# Patient Record
Sex: Male | Born: 2000 | Race: White | Hispanic: No | Marital: Single | Smoking: Never smoker
Health system: Southern US, Community
[De-identification: ages and names within clinical notes are randomized; demographics above are authoritative.]

## PROBLEM LIST (undated history)

## (undated) DIAGNOSIS — E669 Obesity, unspecified: Secondary | ICD-10-CM

## (undated) DIAGNOSIS — K219 Gastro-esophageal reflux disease without esophagitis: Secondary | ICD-10-CM

## (undated) DIAGNOSIS — E66811 Obesity, class 1: Secondary | ICD-10-CM

## (undated) DIAGNOSIS — Z7289 Other problems related to lifestyle: Secondary | ICD-10-CM

---
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
## 2022-06-10 ENCOUNTER — Other Ambulatory Visit: Payer: Self-pay

## 2022-06-10 ENCOUNTER — Emergency Department: Payer: Medicaid Other

## 2022-06-10 DIAGNOSIS — Z20822 Contact with and (suspected) exposure to covid-19: Secondary | ICD-10-CM | POA: Diagnosis present

## 2022-06-10 DIAGNOSIS — Y929 Unspecified place or not applicable: Secondary | ICD-10-CM

## 2022-06-10 DIAGNOSIS — K2970 Gastritis, unspecified, without bleeding: Secondary | ICD-10-CM | POA: Diagnosis present

## 2022-06-10 DIAGNOSIS — K219 Gastro-esophageal reflux disease without esophagitis: Secondary | ICD-10-CM | POA: Diagnosis present

## 2022-06-10 DIAGNOSIS — G030 Nonpyogenic meningitis: Secondary | ICD-10-CM | POA: Diagnosis present

## 2022-06-10 DIAGNOSIS — E669 Obesity, unspecified: Secondary | ICD-10-CM | POA: Diagnosis present

## 2022-06-10 DIAGNOSIS — A770 Spotted fever due to Rickettsia rickettsii: Secondary | ICD-10-CM | POA: Diagnosis present

## 2022-06-10 DIAGNOSIS — F17290 Nicotine dependence, other tobacco product, uncomplicated: Secondary | ICD-10-CM | POA: Diagnosis present

## 2022-06-10 DIAGNOSIS — Z833 Family history of diabetes mellitus: Secondary | ICD-10-CM

## 2022-06-10 DIAGNOSIS — Z6830 Body mass index (BMI) 30.0-30.9, adult: Secondary | ICD-10-CM

## 2022-06-10 DIAGNOSIS — W57XXXA Bitten or stung by nonvenomous insect and other nonvenomous arthropods, initial encounter: Secondary | ICD-10-CM | POA: Diagnosis present

## 2022-06-10 DIAGNOSIS — A419 Sepsis, unspecified organism: Principal | ICD-10-CM | POA: Diagnosis present

## 2022-06-10 LAB — CBC WITH DIFFERENTIAL/PLATELET
Abs Immature Granulocytes: 0.07 10*3/uL (ref 0.00–0.07)
Basophils Absolute: 0.1 10*3/uL (ref 0.0–0.1)
Basophils Relative: 0 %
Eosinophils Absolute: 0 10*3/uL (ref 0.0–0.5)
Eosinophils Relative: 0 %
HCT: 46.6 % (ref 39.0–52.0)
Hemoglobin: 16.1 g/dL (ref 13.0–17.0)
Immature Granulocytes: 1 %
Lymphocytes Relative: 11 %
Lymphs Abs: 1.7 10*3/uL (ref 0.7–4.0)
MCH: 29.7 pg (ref 26.0–34.0)
MCHC: 34.5 g/dL (ref 30.0–36.0)
MCV: 86 fL (ref 80.0–100.0)
Monocytes Absolute: 1.6 10*3/uL — ABNORMAL HIGH (ref 0.1–1.0)
Monocytes Relative: 10 %
Neutro Abs: 12 10*3/uL — ABNORMAL HIGH (ref 1.7–7.7)
Neutrophils Relative %: 78 %
Platelets: 314 10*3/uL (ref 150–400)
RBC: 5.42 MIL/uL (ref 4.22–5.81)
RDW: 12.3 % (ref 11.5–15.5)
WBC: 15.4 10*3/uL — ABNORMAL HIGH (ref 4.0–10.5)
nRBC: 0 % (ref 0.0–0.2)

## 2022-06-10 LAB — URINALYSIS, ROUTINE W REFLEX MICROSCOPIC
Bilirubin Urine: NEGATIVE
Glucose, UA: NEGATIVE mg/dL
Hgb urine dipstick: NEGATIVE
Ketones, ur: NEGATIVE mg/dL
Leukocytes,Ua: NEGATIVE
Nitrite: NEGATIVE
Protein, ur: NEGATIVE mg/dL
Specific Gravity, Urine: 1.017 (ref 1.005–1.030)
pH: 7 (ref 5.0–8.0)

## 2022-06-10 LAB — COMPREHENSIVE METABOLIC PANEL
ALT: 36 U/L (ref 0–44)
AST: 24 U/L (ref 15–41)
Albumin: 4.1 g/dL (ref 3.5–5.0)
Alkaline Phosphatase: 57 U/L (ref 38–126)
Anion gap: 10 (ref 5–15)
BUN: 11 mg/dL (ref 6–20)
CO2: 26 mmol/L (ref 22–32)
Calcium: 9.1 mg/dL (ref 8.9–10.3)
Chloride: 101 mmol/L (ref 98–111)
Creatinine, Ser: 0.87 mg/dL (ref 0.61–1.24)
GFR, Estimated: >60 mL/min (ref >60)
Glucose, Bld: 107 mg/dL — ABNORMAL HIGH (ref 70–99)
Potassium: 3.7 mmol/L (ref 3.5–5.1)
Sodium: 137 mmol/L (ref 135–145)
Total Bilirubin: 1.2 mg/dL (ref 0.3–1.2)
Total Protein: 7.8 g/dL (ref 6.5–8.1)

## 2022-06-10 LAB — SARS CORONAVIRUS 2 BY RT PCR: SARS Coronavirus 2 by RT PCR: NEGATIVE

## 2022-06-10 LAB — LACTIC ACID, PLASMA: Lactic Acid, Venous: 1.3 mmol/L (ref 0.5–1.9)

## 2022-06-10 MED ORDER — ACETAMINOPHEN 500 MG PO TABS
1000.0000 mg | ORAL_TABLET | Freq: Once | ORAL | Status: AC
  Administered 2022-06-10: 1000 mg via ORAL
  Filled 2022-06-10: qty 2

## 2022-06-10 NOTE — ED Triage Notes (Signed)
Pt presents via POV c/o fever, headache, and abd pain. Fever 103.1 currently. Reports symptoms started this am.

## 2022-06-11 ENCOUNTER — Inpatient Hospital Stay
Admission: EM | Admit: 2022-06-11 | Discharge: 2022-06-12 | DRG: 871 | Disposition: A | Discharge status: Home / Self Care | Payer: Medicaid Other | Attending: Internal Medicine | Admitting: Internal Medicine

## 2022-06-11 ENCOUNTER — Encounter: Payer: Self-pay | Admitting: Internal Medicine

## 2022-06-11 ENCOUNTER — Emergency Department: Payer: Medicaid Other

## 2022-06-11 ENCOUNTER — Other Ambulatory Visit: Payer: Self-pay

## 2022-06-11 DIAGNOSIS — G030 Nonpyogenic meningitis: Secondary | ICD-10-CM | POA: Diagnosis present

## 2022-06-11 DIAGNOSIS — R519 Headache, unspecified: Secondary | ICD-10-CM | POA: Diagnosis present

## 2022-06-11 DIAGNOSIS — A770 Spotted fever due to Rickettsia rickettsii: Secondary | ICD-10-CM | POA: Diagnosis present

## 2022-06-11 DIAGNOSIS — A419 Sepsis, unspecified organism: Secondary | ICD-10-CM | POA: Diagnosis present

## 2022-06-11 DIAGNOSIS — E669 Obesity, unspecified: Secondary | ICD-10-CM | POA: Diagnosis present

## 2022-06-11 DIAGNOSIS — G039 Meningitis, unspecified: Secondary | ICD-10-CM | POA: Diagnosis not present

## 2022-06-11 DIAGNOSIS — A879 Viral meningitis, unspecified: Principal | ICD-10-CM

## 2022-06-11 DIAGNOSIS — Z7289 Other problems related to lifestyle: Secondary | ICD-10-CM

## 2022-06-11 DIAGNOSIS — K2970 Gastritis, unspecified, without bleeding: Secondary | ICD-10-CM | POA: Diagnosis present

## 2022-06-11 DIAGNOSIS — R509 Fever, unspecified: Secondary | ICD-10-CM

## 2022-06-11 DIAGNOSIS — Z6830 Body mass index (BMI) 30.0-30.9, adult: Secondary | ICD-10-CM | POA: Diagnosis not present

## 2022-06-11 DIAGNOSIS — K219 Gastro-esophageal reflux disease without esophagitis: Secondary | ICD-10-CM | POA: Diagnosis present

## 2022-06-11 DIAGNOSIS — F17290 Nicotine dependence, other tobacco product, uncomplicated: Secondary | ICD-10-CM | POA: Diagnosis present

## 2022-06-11 DIAGNOSIS — W57XXXA Bitten or stung by nonvenomous insect and other nonvenomous arthropods, initial encounter: Secondary | ICD-10-CM | POA: Diagnosis present

## 2022-06-11 DIAGNOSIS — Y929 Unspecified place or not applicable: Secondary | ICD-10-CM | POA: Diagnosis not present

## 2022-06-11 DIAGNOSIS — Z833 Family history of diabetes mellitus: Secondary | ICD-10-CM | POA: Diagnosis not present

## 2022-06-11 DIAGNOSIS — Z20822 Contact with and (suspected) exposure to covid-19: Secondary | ICD-10-CM | POA: Diagnosis present

## 2022-06-11 HISTORY — DX: Obesity, unspecified: E66.9

## 2022-06-11 HISTORY — DX: Gastro-esophageal reflux disease without esophagitis: K21.9

## 2022-06-11 HISTORY — DX: Obesity, class 1: E66.811

## 2022-06-11 HISTORY — DX: Other problems related to lifestyle: Z72.89

## 2022-06-11 LAB — CSF CELL COUNT WITH DIFFERENTIAL
Eosinophils, CSF: 0 % (ref 0–1)
Eosinophils, CSF: 0 % (ref 0–1)
Lymphs, CSF: 36 % — ABNORMAL LOW (ref 40–80)
Lymphs, CSF: 58 % (ref 40–80)
Monocyte-Macrophage-Spinal Fluid: 42 % (ref 15–45)
Monocyte-Macrophage-Spinal Fluid: 64 % — ABNORMAL HIGH (ref 15–45)
Other Cells, CSF: 0
Other Cells, CSF: 0
RBC Count, CSF: 0 /mm3
RBC Count, CSF: 0 /mm3
Segmented Neutrophils-CSF: 0 % (ref 0–6)
Segmented Neutrophils-CSF: 0 % (ref 0–6)
Tube #: 1
Tube #: 4
WBC, CSF: 12 /mm3 (ref 0–5)
WBC, CSF: 23 /mm3 (ref 0–5)

## 2022-06-11 LAB — MENINGITIS/ENCEPHALITIS PANEL (CSF)

## 2022-06-11 LAB — RESPIRATORY PANEL BY PCR

## 2022-06-11 LAB — PROCALCITONIN: Procalcitonin: <0.1 ng/mL

## 2022-06-11 LAB — PROTIME-INR
INR: 1.1 (ref 0.8–1.2)
Prothrombin Time: 14 seconds (ref 11.4–15.2)

## 2022-06-11 LAB — PROTEIN AND GLUCOSE, CSF
Glucose, CSF: 61 mg/dL (ref 40–70)
Total  Protein, CSF: 17 mg/dL (ref 15–45)

## 2022-06-11 LAB — PATHOLOGIST SMEAR REVIEW

## 2022-06-11 MED ORDER — DEXTROSE 5 % IV SOLN
10.0000 mg/kg | Freq: Once | INTRAVENOUS | Status: AC
  Administered 2022-06-11: 755 mg via INTRAVENOUS
  Filled 2022-06-11: qty 15.1

## 2022-06-11 MED ORDER — SODIUM CHLORIDE 0.9 % IV SOLN: 2.0000 g | Freq: Two times a day (BID) | INTRAVENOUS | Status: DC

## 2022-06-11 MED ORDER — ACETAMINOPHEN 325 MG PO TABS
650.0000 mg | ORAL_TABLET | Freq: Four times a day (QID) | ORAL | Status: DC | PRN
  Administered 2022-06-11: 650 mg via ORAL
  Filled 2022-06-11: qty 2

## 2022-06-11 MED ORDER — VANCOMYCIN HCL IN DEXTROSE 1-5 GM/200ML-% IV SOLN
1000.0000 mg | Freq: Once | INTRAVENOUS | Status: DC
  Filled 2022-06-11: qty 200

## 2022-06-11 MED ORDER — SODIUM CHLORIDE 0.9 % IV SOLN
2.0000 g | Freq: Once | INTRAVENOUS | Status: AC
  Administered 2022-06-11: 2 g via INTRAVENOUS
  Filled 2022-06-11: qty 20

## 2022-06-11 MED ORDER — SODIUM CHLORIDE 0.9 % IV BOLUS
2000.0000 mL | Freq: Once | INTRAVENOUS | Status: AC
  Administered 2022-06-11: 2000 mL via INTRAVENOUS

## 2022-06-11 MED ORDER — SODIUM CHLORIDE 0.9 % IV SOLN
100.0000 mg | Freq: Two times a day (BID) | INTRAVENOUS | Status: DC
  Filled 2022-06-11: qty 100

## 2022-06-11 MED ORDER — ONDANSETRON HCL 4 MG/2ML IJ SOLN
INTRAMUSCULAR | Status: AC
  Administered 2022-06-11: 4 mg
  Filled 2022-06-11: qty 2

## 2022-06-11 MED ORDER — DOXYCYCLINE HYCLATE 100 MG IV SOLR
200.0000 mg | Freq: Once | INTRAVENOUS | Status: AC
  Administered 2022-06-11: 200 mg via INTRAVENOUS
  Filled 2022-06-11: qty 200

## 2022-06-11 MED ORDER — SODIUM CHLORIDE 0.9 % IV SOLN
100.0000 mg | Freq: Two times a day (BID) | INTRAVENOUS | Status: DC
  Administered 2022-06-12: 100 mg via INTRAVENOUS
  Filled 2022-06-11 (×2): qty 100

## 2022-06-11 MED ORDER — SODIUM CHLORIDE 0.9 % IV SOLN: INTRAVENOUS | Status: DC

## 2022-06-11 MED ORDER — ONDANSETRON HCL 4 MG/2ML IJ SOLN: 4.0000 mg | Freq: Three times a day (TID) | INTRAMUSCULAR | Status: DC | PRN

## 2022-06-11 MED ORDER — DEXAMETHASONE SODIUM PHOSPHATE 10 MG/ML IJ SOLN
10.0000 mg | Freq: Once | INTRAMUSCULAR | Status: AC
  Administered 2022-06-11: 10 mg via INTRAVENOUS
  Filled 2022-06-11: qty 1

## 2022-06-11 MED ORDER — PANTOPRAZOLE SODIUM 40 MG PO TBEC: 40.0000 mg | DELAYED_RELEASE_TABLET | Freq: Every day | ORAL | Status: DC | PRN

## 2022-06-11 MED ORDER — VANCOMYCIN HCL 1500 MG/300ML IV SOLN
1500.0000 mg | Freq: Once | INTRAVENOUS | Status: AC
  Administered 2022-06-11: 1500 mg via INTRAVENOUS
  Filled 2022-06-11: qty 300

## 2022-06-11 NOTE — Assessment & Plan Note (Signed)
-  see above 

## 2022-06-11 NOTE — ED Provider Notes (Signed)
[facility] Provider Note    Event Date/Time   First MD Initiated Contact with Patient 06/11/22 0211     (approximate)   History   Headache (abd) and Abdominal Pain   HPI  Johnathan Mosley is a 21 y.o. male with no significant past medical history who presents to the emergency department complaints of fever, headache and neck stiffness that started today.  No cough, congestion, sore throat, chest pain, shortness of breath, abdominal pain, nausea, vomiting, diarrhea, dysuria, rash.  No known sick contacts or recent travel.   History provided by patient and significant other.    History reviewed. No pertinent past medical history.  History reviewed. No pertinent surgical history.  MEDICATIONS:  Prior to Admission medications   Not on File    Physical Exam   Triage Vital Signs: ED Triage Vitals  Enc Vitals Group     BP 06/10/22 1956 123/81     Pulse Rate 06/10/22 1956 (!) 138     Resp 06/10/22 1956 18     Temp 06/10/22 1956 (!) 103.1 F (39.5 C)     Temp Source 06/10/22 1956 Oral     SpO2 06/10/22 1956 96 %     Weight 06/10/22 1956 217 lb (98.4 kg)     Height 06/10/22 1956 5\' 11"  (1.803 m)     Head Circumference --      Peak Flow --      Pain Score 06/10/22 2010 7     Pain Loc --      Pain Edu? --      Excl. in GC? --     Most recent vital signs: Vitals:   06/11/22 0558 06/11/22 0600  BP:  112/73  Pulse: 91 92  Resp: 18 16  Temp:  98.3 F (36.8 C)  SpO2: 96% 98%    CONSTITUTIONAL: Alert and oriented and responds appropriately to questions. Well-appearing; well-nourished HEAD: Normocephalic, atraumatic EYES: Conjunctivae clear, pupils appear equal, sclera nonicteric ENT: normal nose; moist mucous membranes; No pharyngeal erythema or petechiae, no tonsillar hypertrophy or exudate, no uvular deviation, no unilateral swelling in posterior oropharynx, no trismus or drooling, no muffled voice, normal phonation, no stridor, airway  patent. NECK: Supple, normal ROM, no meningismus, no cervical lymphadenopathy CARD: RRR; S1 and S2 appreciated; no murmurs, no clicks, no rubs, no gallops RESP: Normal chest excursion without splinting or tachypnea; breath sounds clear and equal bilaterally; no wheezes, no rhonchi, no rales, no hypoxia or respiratory distress, speaking full sentences ABD/GI: Normal bowel sounds; non-distended; soft, non-tender, no rebound, no guarding, no peritoneal signs BACK: The back appears normal EXT: Normal ROM in all joints; no deformity noted, no edema; no cyanosis SKIN: Normal color for age and race; warm; no rash on exposed skin NEURO: Moves all extremities equally, normal speech, no facial asymmetry PSYCH: The patient's mood and manner are appropriate.   ED Results / Procedures / Treatments   LABS: (all labs ordered are listed, but only abnormal results are displayed) Labs Reviewed  COMPREHENSIVE METABOLIC PANEL - Abnormal; Notable for the following components:      Result Value   Glucose, Bld 107 (*)    All other components within normal limits  CBC WITH DIFFERENTIAL/PLATELET - Abnormal; Notable for the following components:   WBC 15.4 (*)    Neutro Abs 12.0 (*)    Monocytes Absolute 1.6 (*)    All other components within normal limits  URINALYSIS, ROUTINE W REFLEX MICROSCOPIC - Abnormal; Notable  for the following components:   Color, Urine YELLOW (*)    APPearance CLEAR (*)    All other components within normal limits  CSF CELL COUNT WITH DIFFERENTIAL - Abnormal; Notable for the following components:   WBC, CSF 23 (*)    All other components within normal limits  CSF CELL COUNT WITH DIFFERENTIAL - Abnormal; Notable for the following components:   WBC, CSF 12 (*)    Lymphs, CSF 36 (*)    Monocyte-Macrophage-Spinal Fluid 64 (*)    All other components within normal limits  SARS CORONAVIRUS 2 BY RT PCR  CSF CULTURE W GRAM STAIN  CULTURE, BLOOD (ROUTINE X 2)  CULTURE, BLOOD (ROUTINE  X 2)  RESPIRATORY PANEL BY PCR  LACTIC ACID, PLASMA  PROCALCITONIN  PROTEIN AND GLUCOSE, CSF  PROTIME-INR  HSV 1/2 PCR, CSF  HIV ANTIBODY (ROUTINE TESTING W REFLEX)  PATHOLOGIST SMEAR REVIEW     EKG:   RADIOLOGY: My personal review and interpretation of imaging: Chest x-ray clear.  Head CT unremarkable.  I have personally reviewed all radiology reports.   CT HEAD WO CONTRAST ( )  Result Date: 06/11/2022 CLINICAL DATA:  Fever and headache; meningitis suspected EXAM: CT HEAD WITHOUT CONTRAST TECHNIQUE: Contiguous axial images were obtained from the base of the skull through the vertex without intravenous contrast. RADIATION DOSE REDUCTION: This exam was performed according to the departmental dose-optimization program which includes automated exposure control, adjustment of the mA and/or kV according to patient size and/or use of iterative reconstruction technique. COMPARISON:  None Available. FINDINGS: Brain: No intracranial hemorrhage, mass effect, or evidence of acute infarct. No hydrocephalus. No extra-axial fluid collection. Vascular: No hyperdense vessel or unexpected calcification. Skull: No fracture or focal lesion. Sinuses/Orbits: No acute finding. Paranasal sinuses and mastoid air cells are well aerated. Other: None. IMPRESSION: Unremarkable noncontrast CT head. If there is ongoing concern for meningitis consider MRI for further evaluation. Electronically Signed   By: Minerva Fester M.D.   On: 06/11/2022 03:38   DG Chest 2 View  Result Date: 06/10/2022 CLINICAL DATA:  Fever, headache and abdominal pain EXAM: CHEST - 2 VIEW COMPARISON:  None Available. FINDINGS: The heart size and mediastinal contours are within normal limits. Both lungs are clear. The visualized skeletal structures are unremarkable. IMPRESSION: No active cardiopulmonary disease. Electronically Signed   By: Judie Petit.  Shick M.D.   On: 06/10/2022 20:40     PROCEDURES:  Critical Care performed: Yes, see critical care  procedure note(s)   CRITICAL CARE Performed by: Baxter Hire Roseanne Juenger   Total critical care time: 45 minutes  Critical care time was exclusive of separately billable procedures and treating other patients.  Critical care was necessary to treat or prevent imminent or life-threatening deterioration.  Critical care was time spent personally by me on the following activities: development of treatment plan with patient and/or surrogate as well as nursing, discussions with consultants, evaluation of patient's response to treatment, examination of patient, obtaining history from patient or surrogate, ordering and performing treatments and interventions, ordering and review of laboratory studies, ordering and review of radiographic studies, pulse oximetry and re-evaluation of patient's condition.   .Lumbar Puncture  Date/Time: 06/11/2022 5:22 AM  Performed by: Kijana Estock, Layla Maw, DO Authorized by: Denney Shein, Layla Maw, DO   Consent:    Consent obtained:  Written   Consent given by:  Patient   Risks, benefits, and alternatives were discussed: yes     Risks discussed:  Bleeding, headache, pain, repeat procedure, nerve damage and infection  Alternatives discussed:  Observation Universal protocol:    Procedure explained and questions answered to patient or proxy's satisfaction: yes     Relevant documents present and verified: yes     Test results available: yes     Imaging studies available: yes     Required blood products, implants, devices, and special equipment available: yes     Immediately prior to procedure a time out was called: yes     Site/side marked: yes     Patient identity confirmed:  Verbally with patient Pre-procedure details:    Procedure purpose:  Diagnostic   Preparation: Patient was prepped and draped in usual sterile fashion   Anesthesia:    Anesthesia method:  Local infiltration   Local anesthetic:  Lidocaine 1% w/o epi Procedure details:    Lumbar space:  L4-L5 interspace    Patient position:  Sitting   Needle gauge:  20   Needle type:  Spinal needle - Quincke tip   Needle length (in):  3.5   Ultrasound guidance: no     Number of attempts:  5 or more   Fluid appearance:  Clear   Tubes of fluid:  4   Total volume (ml):  6 Post-procedure details:    Puncture site:  Adhesive bandage applied and direct pressure applied   Procedure completion:  Tolerated well, no immediate complications     IMPRESSION / MDM / ASSESSMENT AND PLAN / ED COURSE  I reviewed the triage vital signs and the nursing notes.    Patient here with fevers, neck pain and neck stiffness.  No meningismus on exam but continues to have mild headache.  The patient is on the cardiac monitor to evaluate for evidence of arrhythmia and/or significant heart rate changes.   DIFFERENTIAL DIAGNOSIS (includes but not limited to):   Viral illness, meningitis, doubt encephalitis, doubt intracranial abscess, doubt bacteremia or sepsis   Patient's presentation is most consistent with acute presentation with potential threat to life or bodily function.   PLAN: CBC, CMP, urine, chest x-ray, COVID swab obtained from triage.  He does have a leukocytosis of 15,000 with left shift.  Normal lactic.  Urine shows no sign of infection.  Chest x-ray reviewed and interpreted by myself and the radiologist and shows no acute abnormality.  Discussed risk and benefits of lumbar puncture to rule out meningitis given headache, neck pain, fever today.  He has talked this through with his significant other and they would like to proceed.  Will obtain head CT, coags.  Will give vancomycin, Rocephin, acyclovir for broad coverage.   MEDICATIONS GIVEN IN ED: Medications  0.9 %  sodium chloride infusion ( Intravenous New Bag/Given 06/11/22 0407)  vancomycin (VANCOCIN) IVPB 1000 mg/200 mL premix (has no administration in time range)    Followed by  vancomycin (VANCOREADY) IVPB 1500 mg/300 mL (1,500 mg Intravenous New Bag/Given  06/11/22 7062)  acetaminophen (TYLENOL) tablet 1,000 mg (1,000 mg Oral Given 06/10/22 2014)  cefTRIAXone (ROCEPHIN) 2 g in sodium chloride 0.9 % 100 mL IVPB (0 g Intravenous Stopped 06/11/22 0530)  acyclovir (ZOVIRAX) 755 mg in dextrose 5 % 250 mL IVPB (0 mg Intravenous Stopped 06/11/22 0659)  ondansetron (ZOFRAN) 4 MG/2ML injection (4 mg  Given 06/11/22 0521)     ED COURSE: Patient's procalcitonin has come back negative.  CT head reviewed and interpreted by myself and radiologist and shows no acute abnormality.  INR normal.  I was able to perform lumbar puncture with some difficulty due to patient  positioning but was able to send off CSF.  CSF shows normal total protein, normal glucose, no organisms on Gram stain.  White blood cell count however is elevated on cell count.  Concern for possible viral meningitis.  We will continue antibiotics and antivirals and admit.  Culture pending.   CONSULTS:   Consulted and discussed patient's case with hospitalist, Dr. Clyde Lundborg.  I have recommended admission and consulting physician agrees and will place admission orders.  Patient (and family if present) agree with this plan.   I reviewed all nursing notes, vitals, pertinent previous records.  All labs, EKGs, imaging ordered have been independently reviewed and interpreted by myself.    OUTSIDE RECORDS REVIEWED: Reviewed patient's last office visit with Clydie Braun on 02/18/2022.       FINAL CLINICAL IMPRESSION(S) / ED DIAGNOSES   Final diagnoses:  Fever, unspecified  Bad headache  Meningitis, viral     Rx / DC Orders   ED Discharge Orders     None        Note:  This document was prepared using Dragon voice recognition software and may include unintentional dictation errors.   Yuvin Bussiere, Layla Maw, DO 06/11/22 [phone]

## 2022-06-11 NOTE — Progress Notes (Signed)
PHARMACY -  BRIEF ANTIBIOTIC NOTE   Pharmacy has received consult(s) for Vancomycin, Acyclovir from an ED provider.  The patient's profile has been reviewed for ht/wt/allergies/indication/available labs.    One time order(s) placed for Vancomycin 2500 mg IV X 1 and Acyclovir 755 mg (10 mg/kg IBW) IV X 1.   Further antibiotics/pharmacy consults should be ordered by admitting physician if indicated.                       Thank you, Anyra Kaufman D 06/11/2022  3:30 AM

## 2022-06-11 NOTE — H&P (Signed)
History and Physical    Johnathan Mosley [id number] DOB: 05/24/2001 DOA: 06/11/2022  Referring MD/NP/PA:   PCP: Leonel Ramsay, MD   Patient coming from:  The patient is coming from home.  At baseline, pt is independent for most of ADL.        Chief Complaint: fever and headache  HPI: Johnathan Mosley is a 21 y.o. male with medical history significant of obesity obesity BMI 30.27, who presents with fever and headache.  Patient states that he started having fever, chills, headache, posterior neck pain since yesterday morning.  His temperature was 103.1 at home.  No confusion.  Headache is located in the top of his head, 6 out of 10 in severity, dull, nonradiating.  No injury.  Denies chest pain, cough, shortness of breath.  No nausea, vomiting, diarrhea or abdominal pain.  No symptoms of UTI.  LP was performed by EDP, the initial results showed WBC 12, 23, protein 17, glucose 61, lymphocyte 36.    Data reviewed independently and ED Course: pt was found to have WBC 15.4, lactic acid 1.3, INR 1.1, negative urinalysis, negative COVID PCR, GFR> 60, temperature around 3.1, blood pressure 112/73, heart rate of 138, RR 18, oxygen saturation 98% on room air.  Chest x-ray negative.  CT of head is negative.  Patient is admitted to telemetry bed as inpatient. Dr. Delaine Lame of ID is consulted.  EKG: Not done in ED, will get one.   Review of Systems:   General: has fevers, chills, no body weight gain, has fatigue HEENT: no blurry vision, hearing changes or sore throat. Has HA and neck pain Respiratory: no dyspnea, coughing, wheezing CV: no chest pain, no palpitations GI: no nausea, vomiting, abdominal pain, diarrhea, constipation GU: no dysuria, burning on urination, increased urinary frequency, hematuria  Ext: no leg edema Neuro: no unilateral weakness, numbness, or tingling, no vision change or hearing loss Skin: no rash, no skin tear. MSK: No muscle spasm, no deformity, no limitation  of range of movement in spin Heme: No easy bruising.  Travel history: No recent long distant travel.   Allergy: No Known Allergies  Past Medical History:  Diagnosis Date   Engages in vaping    GERD (gastroesophageal reflux disease)    Obesity (BMI 30.0-34.9)     History reviewed. No pertinent surgical history.  Social History:  reports that he has never smoked. He has never used smokeless tobacco. He reports that he does not drink alcohol and does not use drugs. Pt states that he is vaping.  Family History:  Family History  Problem Relation Age of Onset   Diabetes Paternal Grandmother      Prior to Admission medications   Not on File    Physical Exam: Vitals:   06/11/22 0732 06/11/22 1000 06/11/22 1300 06/11/22 1642  BP:  120/80 118/85 110/62  Pulse:  88 84 79  Resp:  18 20 20   Temp: 99 F (37.2 C) 98.9 F (37.2 C) 99.1 F (37.3 C) 98.2 F (36.8 C)  TempSrc: Oral Oral Oral Oral  SpO2:  100% 98% 96%  Weight:      Height:       General: Not in acute distress HEENT:       Eyes: PERRL, EOMI, no scleral icterus.       ENT: No discharge from the ears and nose, no pharynx injection, no tonsillar enlargement.        Neck: No JVD, no bruit, no mass felt. Heme:  No neck lymph node enlargement. Cardiac: S1/S2, RRR, No murmurs, No gallops or rubs. Respiratory: No rales, wheezing, rhonchi or rubs. GI: Soft, nondistended, nontender, no rebound pain, no organomegaly, BS present. GU: No hematuria Ext: No pitting leg edema bilaterally. 1+DP/PT pulse bilaterally. Musculoskeletal: No joint deformities, No joint redness or warmth, no limitation of ROM in spin. Skin: No rashes.  Neuro: Alert, oriented X3, cranial nerves II-XII grossly intact, moves all extremities normally. Has neck rigidity Psych: Patient is not psychotic, no suicidal or hemocidal ideation.  Labs on Admission: I have personally reviewed following labs and imaging studies  CBC: Recent Labs  Lab  06/10/22 2012  WBC 15.4*  NEUTROABS 12.0*  HGB 16.1  HCT 46.6  MCV 86.0  PLT [id number]   Basic Metabolic Panel: Recent Labs  Lab 06/10/22 2012  NA 137  K 3.7  CL 101  CO2 26  GLUCOSE 107*  BUN 11  CREATININE 0.87  CALCIUM 9.1   GFR: Estimated Creatinine Clearance: 157.8 mL/min (by C-G formula based on SCr of 0.87 mg/dL). Liver Function Tests: Recent Labs  Lab 06/10/22 2012  AST 24  ALT 36  ALKPHOS 57  BILITOT 1.2  PROT 7.8  ALBUMIN 4.1   No results for input(s): "LIPASE", "AMYLASE" in the last 168 hours. No results for input(s): "AMMONIA" in the last 168 hours. Coagulation Profile: Recent Labs  Lab 06/11/22 0448  INR 1.1   Cardiac Enzymes: No results for input(s): "CKTOTAL", "CKMB", "CKMBINDEX", "TROPONINI" in the last 168 hours. BNP (last 3 results) No results for input(s): "PROBNP" in the last 8760 hours. HbA1C: No results for input(s): "HGBA1C" in the last 72 hours. CBG: No results for input(s): "GLUCAP" in the last 168 hours. Lipid Profile: No results for input(s): "CHOL", "HDL", "LDLCALC", "TRIG", "CHOLHDL", "LDLDIRECT" in the last 72 hours. Thyroid Function Tests: No results for input(s): "TSH", "T4TOTAL", "FREET4", "T3FREE", "THYROIDAB" in the last 72 hours. Anemia Panel: No results for input(s): "VITAMINB12", "FOLATE", "FERRITIN", "TIBC", "IRON", "RETICCTPCT" in the last 72 hours. Urine analysis:    Component Value Date/Time   COLORURINE YELLOW (A) 06/10/2022 2012   APPEARANCEUR CLEAR (A) 06/10/2022 2012   APPEARANCEUR Clear 09/08/2014 0855   LABSPEC 1.017 06/10/2022 2012   LABSPEC 1.024 09/08/2014 0855   PHURINE 7.0 06/10/2022 2012   GLUCOSEU NEGATIVE 06/10/2022 2012   GLUCOSEU Negative 09/08/2014 0855   HGBUR NEGATIVE 06/10/2022 2012   BILIRUBINUR NEGATIVE 06/10/2022 2012   BILIRUBINUR Negative 09/08/2014 0855   Ida 06/10/2022 2012   PROTEINUR NEGATIVE 06/10/2022 2012   NITRITE NEGATIVE 06/10/2022 2012   LEUKOCYTESUR  NEGATIVE 06/10/2022 2012   LEUKOCYTESUR Negative 09/08/2014 0855   Sepsis Labs: @LABRCNTIP (procalcitonin:4,lacticidven:4) ) Recent Results (from the past 240 hour(s))  SARS Coronavirus 2 by RT PCR (hospital order, performed in [place] hospital lab) *cepheid single result test* Anterior Nasal Swab     Status: None   Collection Time: 06/10/22  7:57 PM   Specimen: Anterior Nasal Swab  Result Value Ref Range Status   SARS Coronavirus 2 by RT PCR NEGATIVE NEGATIVE Final    Comment: (NOTE) SARS-CoV-2 target nucleic acids are NOT DETECTED.  The SARS-CoV-2 RNA is generally detectable in upper and lower respiratory specimens during the acute phase of infection. The lowest concentration of SARS-CoV-2 viral copies this assay can detect is 250 copies / mL. A negative result does not preclude SARS-CoV-2 infection and should not be used as the sole basis for treatment or other patient management decisions.  A negative result  may occur with improper specimen collection / handling, submission of specimen other than nasopharyngeal swab, presence of viral mutation(s) within the areas targeted by this assay, and inadequate number of viral copies (<250 copies / mL). A negative result must be combined with clinical observations, patient history, and epidemiological information.  Fact Sheet for Patients:   RoadLapTop.co.za  Fact Sheet for Healthcare Providers: http://kim-miller.com/  This test is not yet approved or  cleared by the [place] FDA and has been authorized for detection and/or diagnosis of SARS-CoV-2 by FDA under an Emergency Use Authorization (EUA).  This EUA will remain in effect (meaning this test can be used) for the duration of the COVID-19 declaration under Section 564(b)(1) of the Act, 21 U.S.C. section 360bbb-3(b)(1), unless the authorization is terminated or revoked sooner.  Performed at [facility], [address] [place], [place] [address]   Respiratory (~20 pathogens) panel by PCR     Status: None   Collection Time: 06/11/22  3:41 AM   Specimen: Nasopharyngeal Swab; Respiratory  Result Value Ref Range Status   Adenovirus NOT DETECTED NOT DETECTED Final   Coronavirus 229E NOT DETECTED NOT DETECTED Final    Comment: (NOTE) The Coronavirus on the Respiratory Panel, DOES NOT test for the novel  Coronavirus (2019 nCoV)    Coronavirus HKU1 NOT DETECTED NOT DETECTED Final   Coronavirus NL63 NOT DETECTED NOT DETECTED Final   Coronavirus OC43 NOT DETECTED NOT DETECTED Final   Metapneumovirus NOT DETECTED NOT DETECTED Final   Rhinovirus / Enterovirus NOT DETECTED NOT DETECTED Final   Influenza A NOT DETECTED NOT DETECTED Final   Influenza B NOT DETECTED NOT DETECTED Final   Parainfluenza Virus 1 NOT DETECTED NOT DETECTED Final   Parainfluenza Virus 2 NOT DETECTED NOT DETECTED Final   Parainfluenza Virus 3 NOT DETECTED NOT DETECTED Final   Parainfluenza Virus 4 NOT DETECTED NOT DETECTED Final   Respiratory Syncytial Virus NOT DETECTED NOT DETECTED Final   Bordetella pertussis NOT DETECTED NOT DETECTED Final   Bordetella Parapertussis NOT DETECTED NOT DETECTED Final   Chlamydophila pneumoniae NOT DETECTED NOT DETECTED Final   Mycoplasma pneumoniae NOT DETECTED NOT DETECTED Final    Comment: Performed at [facility], [address], [place], [place] [address]  CSF culture w Gram Stain     Status: None (Preliminary result)   Collection Time: 06/11/22  5:20 AM   Specimen: CSF; Cerebrospinal Fluid  Result Value Ref Range Status   Specimen Description CSF  Final   Special Requests NONE  Final   Gram Stain   Final    WBC SEEN NO RBC SEEN NO ORGANISMS SEEN Performed at [facility], [address] [place], [place] [address]    Culture PENDING  Incomplete   Report Status PENDING  Incomplete     Radiological Exams on Admission: CT HEAD WO CONTRAST ( )  Result Date:  06/11/2022 CLINICAL DATA:  Fever and headache; meningitis suspected EXAM: CT HEAD WITHOUT CONTRAST TECHNIQUE: Contiguous axial images were obtained from the base of the skull through the vertex without intravenous contrast. RADIATION DOSE REDUCTION: This exam was performed according to the departmental dose-optimization program which includes automated exposure control, adjustment of the mA and/or kV according to patient size and/or use of iterative reconstruction technique. COMPARISON:  None Available. FINDINGS: Brain: No intracranial hemorrhage, mass effect, or evidence of acute infarct. No hydrocephalus. No extra-axial fluid collection. Vascular: No hyperdense vessel or unexpected calcification. Skull: No fracture or focal lesion. Sinuses/Orbits:  No acute finding. Paranasal sinuses and mastoid air cells are well aerated. Other: None. IMPRESSION: Unremarkable noncontrast CT head. If there is ongoing concern for meningitis consider MRI for further evaluation. Electronically Signed   By: Minerva Fester M.D.   On: 06/11/2022 03:38   DG Chest 2 View  Result Date: 06/10/2022 CLINICAL DATA:  Fever, headache and abdominal pain EXAM: CHEST - 2 VIEW COMPARISON:  None Available. FINDINGS: The heart size and mediastinal contours are within normal limits. Both lungs are clear. The visualized skeletal structures are unremarkable. IMPRESSION: No active cardiopulmonary disease. Electronically Signed   By: Judie Petit.  Shick M.D.   On: 06/10/2022 20:40      Assessment/Plan Principal Problem:   Meningitis Active Problems:   Sepsis (HCC)   Obesity with body mass index (BMI) of 30.0 to 39.9   GERD (gastroesophageal reflux disease)   Engages in vaping   Assessment and Plan: * Meningitis Sepsis due to meningitis: Patient's symptoms are consistent with meningitis.  CT head negative. LP mildly elevated lymphocytic pleocytosis. Normal protein and glucose.  Meningitis/encephalopathy panel is negative.  Patient meets criteria  for sepsis with WBC 15.4, heart rate 139.  Lactic acid is normal. Possibly has aseptic meningitis. Consulted Dr. Joylene Draft of ID. Patient was initially started on vancomycin, Rocephin and acyclovir.  Given 10 mg of Decadron. Will d/c vancomycin, Rocephin, acyclovir.   -Admitted to telemetry bed as inpatient -Check procalcitonin level --> < 0.10 - IVF: 2000 ml of NS -Will start Doxycycline per Dr. Rivka Safer - Will RMSF Abs and HIV test pending - Ehrlichia pcr - f/u Bx  Sepsis (HCC) -see above  GERD (gastroesophageal reflux disease) -protonix  Obesity with body mass index (BMI) of 30.0 to 39.9  BMI= 30.27   and BW= 98.4 -Diet and exercise.   -Encouraged to lose weight.    Engages in vaping - Counseling about importance of quitting vaping -Patient states that he does not need nicotine patch          DVT ppx: SCD  Code Status: Full code  Family Communication:    Yes, patient's girlfriend  by phone  Disposition Plan:  Anticipate discharge back to previous environment  Consults called:   Dr. Rivka Safer of ID is consulted.  Admission status and Level of care: Telemetry Medical:   as inpt       Dispo: The patient is from: Home              Anticipated d/c is to: Home              Anticipated d/c date is: 2 days              Patient currently is not medically stable to d/c.    Severity of Illness:  The appropriate patient status for this patient is INPATIENT. Inpatient status is judged to be reasonable and necessary in order to provide the required intensity of service to ensure the patient's safety. The patient's presenting symptoms, physical exam findings, and initial radiographic and laboratory data in the context of their chronic comorbidities is felt to place them at high risk for further clinical deterioration. Furthermore, it is not anticipated that the patient will be medically stable for discharge from the hospital within 2 midnights of admission.   *  I certify that at the point of admission it is my clinical judgment that the patient will require inpatient hospital care spanning beyond 2 midnights from the point of admission due to high intensity of  service, high risk for further deterioration and high frequency of surveillance required.*       Date of Service 06/11/2022    Ivor [facility]   If 7PM-7AM, please contact night-coverage [email or web address] 06/11/2022, 7:06 PM

## 2022-06-11 NOTE — Assessment & Plan Note (Signed)
  BMI= 30.27   and BW= 98.4 -Diet and exercise.   -Encouraged to lose weight.

## 2022-06-11 NOTE — ED Notes (Signed)
Pt is A&Ox4. Pt is ambulatory. Pt is endorsing  fever, chills, and headache since 9/11 AM. Pt took Ibuprofen at home with relief. Pt began feeling bad again and came into hospital where he had a temp of 103.1 in triage. Pt was wrapped up in sweat shirt and blankets when this nurse walked in room. The pt had the sweatshirt removed.

## 2022-06-11 NOTE — Consult Note (Addendum)
NAME: Johnathan Mosley  DOB: [birth year]  MRN: [id number]  Date/Time: 06/11/2022 5:50 PM  REQUESTING PROVIDER: Dr.Niu Subjective:  REASON FOR CONSULT: meningitis ? Johnathan Mosley is a 21 y.o. 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 He went to work on Sunday and was fine . On Monday he woke up and had headache. Took 800mg  of Ibuprofen and that calmed the headache. Eh went to pick up his girlfriend from work ,and then the head ache got worse again. He was also having abdominal pain and acid reflux. As he was having fever and chills he took 200mg  X 2 Ibuprofen -He came to the ED around 8pm. He says sitting in the ED waiting area for a few hours made his eyes hurt from the light Vital sin the ED  06/10/22 19:56  BP 123/81  Temp 103.1 F (39.5 C) !  Pulse Rate 138 !  Resp 18  SpO2 96 %  Weight 217 lb  Height 5\' 11"  (1.803 m)  BMI (Calculated) 30.28    Latest Reference Range & Units 06/10/22 20:12  WBC 4.0 - 10.5 K/uL 15.4 (H)  Hemoglobin 13.0 - 17.0 g/dL 08/10/22  HCT - 08/10/22 % 46.6  Platelets 150 - 400 K/uL 314  Creatinine 0.61 - 1.24 mg/dL 78.2   He had LP at 95.6 am and was given decadron 10 mg and started on triple antibiotics ( vanco/ceftriaxone and acyclovir) As the csf cell count was 12/23 WBC ( 0% N) I am asked to see him for viral meningitis  Pt lives with his parents and 2 siblings, no one is sick. His girlfriend is not sick He denies cough, runny nose, shortness of breath, diarrhea, rash or joint pain On questioning about tick bite he said he removed a tik attached to his skin on the left buttock a week ago  His brother has a dog and a cat No other Pets No animal bites  Pt last went to the beach in July 2023 In a monogamous relationship He vapes, no alcohol or illicit drug use   Past Medical History:  Diagnosis Date   Engages in vaping    Obesity (BMI 30.0-34.9)     PSH Thinks he had  tonsillectomy  Social History   Socioeconomic History   Marital status: Single    Spouse name: Not on file   Number of children: Not on file   Years of education: Not on file   Highest education level: Not on file  Occupational History   Not on file  Tobacco Use   Smoking status: Never   Smokeless tobacco: Never   Tobacco comments:    Vaping  Vaping Use   Vaping Use: Every day  Substance and Sexual Activity   Alcohol use: Never   Drug use: Never   Sexual activity: Not on file  Other Topics Concern   Not on file  Social History Narrative   Not on file   Social Determinants of Health   Financial Resource Strain: Not on file  Food Insecurity: No Food Insecurity (06/11/2022)   Hunger Vital Sign    Worried About Running Out of Food in the Last Year: Never true    Ran Out of Food in the Last Year: Never true  Transportation Needs: No Transportation Needs (06/11/2022)   PRAPARE - Transportation    Lack of Transportation (Medical): No    Lack of  Transportation (Non-Medical): No  Physical Activity: Not on file  Stress: Not on file  Social Connections: Not on file  Intimate Partner Violence: Not At Risk (06/11/2022)   Humiliation, Afraid, Rape, and Kick questionnaire    Fear of Current or Ex-Partner: No    Emotionally Abused: No    Physically Abused: No    Sexually Abused: No    Family History  Problem Relation Age of Onset   Diabetes Paternal Grandmother    No Known Allergies I? Current Facility-Administered Medications  Medication Dose Route Frequency Provider Last Rate Last Admin   0.9 %  sodium chloride infusion   Intravenous Continuous Ward, Kristen N, DO 125 mL/hr at 06/11/22 1707 Infusion Verify at 06/11/22 1707   acetaminophen (TYLENOL) tablet 650 mg  650 mg Oral Q6H PRN Lorretta Harp, MD       doxycycline (VIBRAMYCIN) 100 mg in sodium chloride 0.9 % 250 mL IVPB  100 mg Intravenous Q12H Jahlil Ziller, Rhodia Albright, MD       ondansetron (ZOFRAN) injection 4 mg  4 mg  Intravenous Q8H PRN Lorretta Harp, MD       pantoprazole (PROTONIX) EC tablet 40 mg  40 mg Oral Daily PRN Lorretta Harp, MD         Abtx:  Anti-infectives (From admission, onward)    Start     Dose/Rate Route Frequency Ordered Stop   06/12/22 1800  cefTRIAXone (ROCEPHIN) 2 g in sodium chloride 0.9 % 100 mL IVPB  Status:  Discontinued        2 g 200 mL/hr over 30 Minutes Intravenous 2 times daily 06/11/22 1630 06/11/22 1721   06/11/22 1845  doxycycline (VIBRAMYCIN) 100 mg in sodium chloride 0.9 % 250 mL IVPB        100 mg 125 mL/hr over 120 Minutes Intravenous Every 12 hours 06/11/22 1745     06/11/22 0330  cefTRIAXone (ROCEPHIN) 2 g in sodium chloride 0.9 % 100 mL IVPB        2 g 200 mL/hr over 30 Minutes Intravenous  Once 06/11/22 0321 06/11/22 0530   06/11/22 0330  vancomycin (VANCOCIN) IVPB 1000 mg/200 mL premix  Status:  Discontinued       See Hyperspace for full Linked Orders Report.   1,000 mg 200 mL/hr over 60 Minutes Intravenous  Once 06/11/22 0326 06/11/22 1721   06/11/22 0330  vancomycin (VANCOREADY) IVPB 1500 mg/300 mL       See Hyperspace for full Linked Orders Report.   1,500 mg 150 mL/hr over 120 Minutes Intravenous  Once 06/11/22 0326 06/11/22 1625   06/11/22 0330  acyclovir (ZOVIRAX) 755 mg in dextrose 5 % 250 mL IVPB        10 mg/kg  75.3 kg (Ideal) 265.1 mL/hr over 60 Minutes Intravenous  Once 06/11/22 0328 06/11/22 0659       REVIEW OF SYSTEMS:  Const: fever,  chills, negative weight loss Eyes: negative diplopia or visual changes, negative eye pain ENT: negative coryza, negative sore throat Resp: negative cough, hemoptysis, dyspnea Cards: negative for chest pain, palpitations, lower extremity edema GU: negative for frequency, dysuria and hematuria GI: Negative for abdominal pain, diarrhea, bleeding, constipation Skin: negative for rash and pruritus Heme: negative for easy bruising and gum/nose bleeding MS: neck pain Neurolo:headache, no blurred vision or  diplopia  Psych: negative for feelings of anxiety, depression  Endocrine: negative for thyroid, diabetes Allergy/Immunology- negative for any medication or food allergies ?  Objective:  VITALS:  BP 110/62 (BP Location: Left  Arm)   Pulse 79   Temp 98.2 F (36.8 C) (Oral)   Resp 20   Ht 5\' 11"  (1.803 m)   Wt 98.4 kg   SpO2 96%   BMI 30.27 kg/m   PHYSICAL EXAM:  General: Alert, cooperative, no distress, appears stated age.  Head: Normocephalic, without obvious abnormality, atraumatic. Eyes: Conjunctivae clear, anicteric sclerae. Pupils are equal ENT Nares normal. No drainage or sinus tenderness. Lips, mucosa, and tongue normal. No Thrush Neck: Supple, symmetrical, no adenopathy, thyroid: non tender no carotid bruit and no JVD. Back: No CVA tenderness. Lungs: Clear to auscultation bilaterally. No Wheezing or Rhonchi. No rales. Heart: Regular rate and rhythm, no murmur, rub or gallop. Abdomen: Soft, minimal tenderness above umbilicus,not distended. Bowel sounds normal. No masses Extremities: atraumatic, no cyanosis. No edema. No clubbing Skin: left buttock fold- small area of pinpoint erythema at the site of previous tick bite? Lymph: Cervical, supraclavicular normal. Neurologic: Grossly non-focal Pertinent Labs Lab Results CBC    Component Value Date/Time   WBC 15.4 (H) 06/10/2022 2012   RBC 5.42 06/10/2022 2012   HGB 16.1 06/10/2022 2012   HGB 17.2 09/08/2014 0855   HCT 46.6 06/10/2022 2012   HCT 50.6 09/08/2014 0855   PLT 314 06/10/2022 2012   PLT 341 09/08/2014 0855   MCV 86.0 06/10/2022 2012   MCV 87 09/08/2014 0855   MCH 29.7 06/10/2022 2012   MCHC 34.5 06/10/2022 2012   RDW 12.3 06/10/2022 2012   RDW 13.1 09/08/2014 0855   LYMPHSABS 1.7 06/10/2022 2012   MONOABS 1.6 (H) 06/10/2022 2012   EOSABS 0.0 06/10/2022 2012   BASOSABS 0.1 06/10/2022 2012       Latest Ref Rng & Units 06/10/2022    8:12 PM 09/08/2014    8:55 AM  CMP  Glucose 70 - 99 mg/dL 454107   098105   BUN 6 - 20 mg/dL 11  4   Creatinine 1.190.61 - 1.24 mg/dL 1.470.87  8.290.79   Sodium 562135 - 145 mmol/L 137  139   Potassium 3.5 - 5.1 mmol/L 3.7  3.8   Chloride 98 - 111 mmol/L 101  102   CO2 22 - 32 mmol/L 26  32   Calcium 8.9 - 10.3 mg/dL 9.1  9.4   Total Protein 6.5 - 8.1 g/dL 7.8  8.0   Total Bilirubin 0.3 - 1.2 mg/dL 1.2  1.1   Alkaline Phos 38 - 126 U/L 57  142   AST 15 - 41 U/L 24  22   ALT 0 - 44 U/L 36  22       Microbiology: Recent Results (from the past 240 hour(s))  SARS Coronavirus 2 by RT PCR (hospital order, [facility]) *cepheid single result test* Anterior Nasal Swab     Status: None   Collection Time: 06/10/22  7:57 PM   Specimen: Anterior Nasal Swab  Result Value Ref Range Status   SARS Coronavirus 2 by RT PCR NEGATIVE NEGATIVE Final    Comment: (NOTE) SARS-CoV-2 target nucleic acids are NOT DETECTED.  The SARS-CoV-2 RNA is generally detectable in upper and lower respiratory specimens during the acute phase of infection. The lowest concentration of SARS-CoV-2 viral copies this assay can detect is 250 copies / mL. A negative result does not preclude SARS-CoV-2 infection and should not be used as the sole basis for treatment or other patient management decisions.  A negative result may occur with improper specimen collection / handling, submission of  specimen other than nasopharyngeal swab, presence of viral mutation(s) within the areas targeted by this assay, and inadequate number of viral copies (<250 copies / mL). A negative result must be combined with clinical observations, patient history, and epidemiological information.  Fact Sheet for Patients:   RoadLapTop.co.za  Fact Sheet for Healthcare Providers: http://kim-miller.com/  This test is not yet approved or  cleared by the [place] FDA and has been authorized for detection and/or diagnosis of SARS-CoV-2 by FDA under an  Emergency Use Authorization (EUA).  This EUA will remain in effect (meaning this test can be used) for the duration of the COVID-19 declaration under Section 564(b)(1) of the Act, 21 U.S.C. section 360bbb-3(b)(1), unless the authorization is terminated or revoked sooner.  Performed at [facility], [address] [place], [place] [address]   Respiratory (~20 pathogens) panel by PCR     Status: None   Collection Time: 06/11/22  3:41 AM   Specimen: Nasopharyngeal Swab; Respiratory  Result Value Ref Range Status   Adenovirus NOT DETECTED NOT DETECTED Final   Coronavirus 229E NOT DETECTED NOT DETECTED Final    Comment: (NOTE) The Coronavirus on the Respiratory Panel, DOES NOT test for the novel  Coronavirus (2019 nCoV)    Coronavirus HKU1 NOT DETECTED NOT DETECTED Final   Coronavirus NL63 NOT DETECTED NOT DETECTED Final   Coronavirus OC43 NOT DETECTED NOT DETECTED Final   Metapneumovirus NOT DETECTED NOT DETECTED Final   Rhinovirus / Enterovirus NOT DETECTED NOT DETECTED Final   Influenza A NOT DETECTED NOT DETECTED Final   Influenza B NOT DETECTED NOT DETECTED Final   Parainfluenza Virus 1 NOT DETECTED NOT DETECTED Final   Parainfluenza Virus 2 NOT DETECTED NOT DETECTED Final   Parainfluenza Virus 3 NOT DETECTED NOT DETECTED Final   Parainfluenza Virus 4 NOT DETECTED NOT DETECTED Final   Respiratory Syncytial Virus NOT DETECTED NOT DETECTED Final   Bordetella pertussis NOT DETECTED NOT DETECTED Final   Bordetella Parapertussis NOT DETECTED NOT DETECTED Final   Chlamydophila pneumoniae NOT DETECTED NOT DETECTED Final   Mycoplasma pneumoniae NOT DETECTED NOT DETECTED Final    Comment: Performed at [facility], [address], [place], [place] [address]  CSF culture w Gram Stain     Status: None (Preliminary result)   Collection Time: 06/11/22  5:20 AM   Specimen: CSF; Cerebrospinal Fluid  Result Value Ref Range Status   Specimen Description CSF  Final   Special  Requests NONE  Final   Gram Stain   Final    WBC SEEN NO RBC SEEN NO ORGANISMS SEEN Performed at [facility], [address] [place], [place] [address]    Culture PENDING  Incomplete   Report Status PENDING  Incomplete   Meninigits /encephalitis panel by PCR- neg IMAGING RESULTS:   I have personally reviewed the films ?CXR N CT head N  Impression/Recommendation ?21 yr male presenting with 1 day of headache and fever . Took 800mg  Ibuprofen and then develop dapin abdomen with GERD like symptoms LP mildly elevated lymphocytic pleocytosis Normal protein and glucose  Aseptic meningitis Could be viral-- ME panel is negative for enterovirus, HSV virus-  Recent tick bite - could be  Ehrlichia ? RMSF ? Labs are normal ( no transaminitis, Normal platelet) but could be very early presentation  Will start Doxycycline Will send ehrlichia PCR HIV test pending  DC acyclovir/ ceftriaxone and vanco as no evidence of bacterial meningitis or HSV infection  Gastritis/GERD could be  from Ibuprofen   ___________________________________________________ Discussed with patient, and his partner and requesting provider

## 2022-06-11 NOTE — Assessment & Plan Note (Signed)
-   Counseling about importance of quitting vaping -Patient states that he does not need nicotine patch

## 2022-06-11 NOTE — Assessment & Plan Note (Signed)
protonix

## 2022-06-11 NOTE — ED Notes (Signed)
MD Dr Clyde Lundborg aware of critical WBC and CSF in tubes 1 and 4 on patient.

## 2022-06-11 NOTE — Assessment & Plan Note (Signed)
Sepsis due to meningitis: Patient's symptoms are consistent with meningitis.  CT head negative. LP mildly elevated lymphocytic pleocytosis. Normal protein and glucose.  Meningitis/encephalopathy panel is negative.  Patient meets criteria for sepsis with WBC 15.4, heart rate 139.  Lactic acid is normal. Possibly has aseptic meningitis. Consulted Dr. Joylene Draft of ID. Patient was initially started on vancomycin, Rocephin and acyclovir.  Given 10 mg of Decadron. Will d/c vancomycin, Rocephin, acyclovir.   -Admitted to telemetry bed as inpatient -Check procalcitonin level --> < 0.10 - IVF: 2000 ml of NS -Will start Doxycycline per Dr. Rivka Safer - Will RMSF Abs and HIV test pending - Ehrlichia pcr - f/u Bx

## 2022-06-12 ENCOUNTER — Encounter: Payer: Self-pay | Admitting: Internal Medicine

## 2022-06-12 DIAGNOSIS — G030 Nonpyogenic meningitis: Secondary | ICD-10-CM

## 2022-06-12 DIAGNOSIS — G039 Meningitis, unspecified: Secondary | ICD-10-CM | POA: Diagnosis not present

## 2022-06-12 DIAGNOSIS — A419 Sepsis, unspecified organism: Secondary | ICD-10-CM | POA: Diagnosis not present

## 2022-06-12 LAB — HEPATIC FUNCTION PANEL
ALT: 28 U/L (ref 0–44)
AST: 17 U/L (ref 15–41)
Albumin: 3.2 g/dL — ABNORMAL LOW (ref 3.5–5.0)
Alkaline Phosphatase: 45 U/L (ref 38–126)
Bilirubin, Direct: 0.1 mg/dL (ref 0.0–0.2)
Indirect Bilirubin: 0.6 mg/dL (ref 0.3–0.9)
Total Bilirubin: 0.7 mg/dL (ref 0.3–1.2)
Total Protein: 6.3 g/dL — ABNORMAL LOW (ref 6.5–8.1)

## 2022-06-12 LAB — CBC
HCT: 38.9 % — ABNORMAL LOW (ref 39.0–52.0)
Hemoglobin: 13.7 g/dL (ref 13.0–17.0)
MCH: 29.6 pg (ref 26.0–34.0)
MCHC: 35.2 g/dL (ref 30.0–36.0)
MCV: 84 fL (ref 80.0–100.0)
Platelets: 277 10*3/uL (ref 150–400)
RBC: 4.63 MIL/uL (ref 4.22–5.81)
RDW: 12.3 % (ref 11.5–15.5)
WBC: 15.4 10*3/uL — ABNORMAL HIGH (ref 4.0–10.5)
nRBC: 0 % (ref 0.0–0.2)

## 2022-06-12 LAB — HIV ANTIBODY (ROUTINE TESTING W REFLEX): HIV Screen 4th Generation wRfx: NONREACTIVE

## 2022-06-12 LAB — BASIC METABOLIC PANEL
Anion gap: 5 (ref 5–15)
BUN: 11 mg/dL (ref 6–20)
CO2: 23 mmol/L (ref 22–32)
Calcium: 8.8 mg/dL — ABNORMAL LOW (ref 8.9–10.3)
Chloride: 112 mmol/L — ABNORMAL HIGH (ref 98–111)
Creatinine, Ser: 0.75 mg/dL (ref 0.61–1.24)
GFR, Estimated: >60 mL/min (ref >60)
Glucose, Bld: 128 mg/dL — ABNORMAL HIGH (ref 70–99)
Potassium: 3.8 mmol/L (ref 3.5–5.1)
Sodium: 140 mmol/L (ref 135–145)

## 2022-06-12 MED ORDER — DOXYCYCLINE HYCLATE 100 MG PO CAPS: 100.0000 mg | ORAL_CAPSULE | Freq: Two times a day (BID) | ORAL | 0 refills | Status: AC

## 2022-06-12 NOTE — Discharge Summary (Addendum)
Physician Discharge Summary   Patient: Johnathan Mosley MRN: [id number] DOB: 2001-09-28  Admit date:     06/11/2022  Discharge date: 06/12/22  Discharge Physician: Lurene Shadow   PCP: Mick Sell, MD   Recommendations at discharge:   Follow-up with PCP in 1 to 2 weeks  Discharge Diagnoses: Principal Problem:   Sepsis (HCC) Active Problems:   Meningitis   Obesity with body mass index (BMI) of 30.0 to 39.9   GERD (gastroesophageal reflux disease)   Engages in vaping  Resolved Problems:   * No resolved hospital problems. [facility] Course:  Johnathan Mosley is a 21 year old male with medical history significant for obesity who presented to the hospital with fever and headache.  He reported recent tick bite on the left buttock about a week prior to admission.  He had removed a tick that was attached to his left buttock.  He was febrile with a temperature of around 3.1 F in the emergency department.  He was admitted to the hospital for sepsis secondary to meningitis.  He was treated with empiric IV antimicrobial therapy (ceftriaxone, vancomycin, doxycycline and acyclovir).  He underwent lumbar puncture and CSF fluid analysis ruled out out bacterial meningitis.  Antimicrobial therapy was de-escalated to doxycycline because of recent tick bite.  Fever and headache have resolved.  His condition has improved.  Case was discussed with Dr. Joylene Draft (ID specialist), via secure chat.  From her standpoint, patient can be discharged since his symptoms have resolved.  Serology for [facility] spotted fever and PCR for HSV 1 and 2 are pending at the time of discharge.  Plan was discussed with patient and Lurena Joiner, his girlfriend, at the bedside.    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 Follow up with PCP was recommended.   Consultants: Infectious disease specialist Procedures performed: Lumbar  puncture Disposition: Home Diet recommendation:  Discharge Diet Orders (From admission, onward)     Start     Ordered   06/12/22 0000  Diet - low sodium heart healthy        06/12/22 1504           Cardiac diet DISCHARGE MEDICATION: Allergies as of 06/12/2022   No Known Allergies      Medication List     TAKE these medications    doxycycline 100 MG capsule Commonly known as: VIBRAMYCIN Take 1 capsule (100 mg total) by mouth 2 (two) times daily for 6 days.   omeprazole 40 MG capsule Commonly known as: PRILOSEC Take 40 mg by mouth daily as needed (acid reflux symptoms).        Discharge Exam: Filed Weights   06/10/22 1956  Weight: 98.4 kg   GEN: NAD SKIN: Warm and dry EYES: No pallor or icterus ENT: MMM no neck stiffness CV: RRR PULM: CTA B ABD: soft, ND, NT, +BS CNS: AAO x 3, non focal EXT: No edema or tenderness   Condition at discharge: good  The results of significant diagnostics from this hospitalization (including imaging, microbiology, ancillary and laboratory) are listed below for reference.   Imaging Studies: CT HEAD WO CONTRAST ( )  Result Date: 06/11/2022 CLINICAL DATA:  Fever and headache; meningitis suspected EXAM: CT HEAD WITHOUT CONTRAST TECHNIQUE: Contiguous axial images were obtained from the base of the skull through the vertex without intravenous contrast. RADIATION DOSE REDUCTION: This exam was performed according to the departmental dose-optimization program which includes automated exposure  control, adjustment of the mA and/or kV according to patient size and/or use of iterative reconstruction technique. COMPARISON:  None Available. FINDINGS: Brain: No intracranial hemorrhage, mass effect, or evidence of acute infarct. No hydrocephalus. No extra-axial fluid collection. Vascular: No hyperdense vessel or unexpected calcification. Skull: No fracture or focal lesion. Sinuses/Orbits: No acute finding. Paranasal sinuses and mastoid air cells  are well aerated. Other: None. IMPRESSION: Unremarkable noncontrast CT head. If there is ongoing concern for meningitis consider MRI for further evaluation. Electronically Signed   By: Minerva Fester M.D.   On: 06/11/2022 03:38   DG Chest 2 View  Result Date: 06/10/2022 CLINICAL DATA:  Fever, headache and abdominal pain EXAM: CHEST - 2 VIEW COMPARISON:  None Available. FINDINGS: The heart size and mediastinal contours are within normal limits. Both lungs are clear. The visualized skeletal structures are unremarkable. IMPRESSION: No active cardiopulmonary disease. Electronically Signed   By: Judie Petit.  Shick M.D.   On: 06/10/2022 20:40    Microbiology: Results for orders placed or performed during the hospital encounter of 06/11/22  SARS Coronavirus 2 by RT PCR (hospital order, performed in [facility] hospital lab) *cepheid single result test* Anterior Nasal Swab     Status: None   Collection Time: 06/10/22  7:57 PM   Specimen: Anterior Nasal Swab  Result Value Ref Range Status   SARS Coronavirus 2 by RT PCR NEGATIVE NEGATIVE Final    Comment: (NOTE) SARS-CoV-2 target nucleic acids are NOT DETECTED.  The SARS-CoV-2 RNA is generally detectable in upper and lower respiratory specimens during the acute phase of infection. The lowest concentration of SARS-CoV-2 viral copies this assay can detect is 250 copies / mL. A negative result does not preclude SARS-CoV-2 infection and should not be used as the sole basis for treatment or other patient management decisions.  A negative result may occur with improper specimen collection / handling, submission of specimen other than nasopharyngeal swab, presence of viral mutation(s) within the areas targeted by this assay, and inadequate number of viral copies (<250 copies / mL). A negative result must be combined with clinical observations, patient history, and epidemiological information.  Fact Sheet for Patients:    RoadLapTop.co.za  Fact Sheet for Healthcare Providers: http://kim-miller.com/  This test is not yet approved or  cleared by the [place] FDA and has been authorized for detection and/or diagnosis of SARS-CoV-2 by FDA under an Emergency Use Authorization (EUA).  This EUA will remain in effect (meaning this test can be used) for the duration of the COVID-19 declaration under Section 564(b)(1) of the Act, 21 U.S.C. section 360bbb-3(b)(1), unless the authorization is terminated or revoked sooner.  Performed at [facility], [address] [place], [place] [address]   Culture, blood (Routine X 2)     Status: None (Preliminary result)   Collection Time: 06/11/22  3:41 AM   Specimen: BLOOD  Result Value Ref Range Status   Specimen Description BLOOD RIGTH [facility]  Final   Special Requests   Final    BOTTLES DRAWN AEROBIC AND ANAEROBIC Blood Culture results may not be optimal due to an inadequate volume of blood received in culture bottles   Culture   Final    NO GROWTH 1 DAY Performed at [facility], [address], [place], [place] [address]    Report Status PENDING  Incomplete  Culture, blood (Routine X 2)     Status: None (Preliminary result)   Collection Time: 06/11/22  3:41 AM   Specimen: BLOOD  Result Value Ref Range Status   Specimen Description BLOOD LEGFT AC  Final   Special Requests   Final    BOTTLES DRAWN AEROBIC AND ANAEROBIC Blood Culture results may not be optimal due to an inadequate volume of blood received in culture bottles   Culture   Final    NO GROWTH 1 DAY Performed at [facility], [address] [place], [place] [address]    Report Status PENDING  Incomplete  Respiratory (~20 pathogens) panel by PCR     Status: None   Collection Time: 06/11/22  3:41 AM   Specimen: Nasopharyngeal Swab; Respiratory  Result Value Ref Range Status   Adenovirus NOT DETECTED NOT DETECTED Final    Coronavirus 229E NOT DETECTED NOT DETECTED Final    Comment: (NOTE) The Coronavirus on the Respiratory Panel, DOES NOT test for the novel  Coronavirus (2019 nCoV)    Coronavirus HKU1 NOT DETECTED NOT DETECTED Final   Coronavirus NL63 NOT DETECTED NOT DETECTED Final   Coronavirus OC43 NOT DETECTED NOT DETECTED Final   Metapneumovirus NOT DETECTED NOT DETECTED Final   Rhinovirus / Enterovirus NOT DETECTED NOT DETECTED Final   Influenza A NOT DETECTED NOT DETECTED Final   Influenza B NOT DETECTED NOT DETECTED Final   Parainfluenza Virus 1 NOT DETECTED NOT DETECTED Final   Parainfluenza Virus 2 NOT DETECTED NOT DETECTED Final   Parainfluenza Virus 3 NOT DETECTED NOT DETECTED Final   Parainfluenza Virus 4 NOT DETECTED NOT DETECTED Final   Respiratory Syncytial Virus NOT DETECTED NOT DETECTED Final   Bordetella pertussis NOT DETECTED NOT DETECTED Final   Bordetella Parapertussis NOT DETECTED NOT DETECTED Final   Chlamydophila pneumoniae NOT DETECTED NOT DETECTED Final   Mycoplasma pneumoniae NOT DETECTED NOT DETECTED Final    Comment: Performed at [facility], [address] [place], [place] [address]  CSF culture w Gram Stain     Status: None (Preliminary result)   Collection Time: 06/11/22  5:20 AM   Specimen: CSF; Cerebrospinal Fluid  Result Value Ref Range Status   Specimen Description   Final    CSF Performed at [facility], [address], [place], [place] [address]    Special Requests   Final    NONE Performed at [facility], [address] [place], [place] [address]    Gram Stain   Final    WBC SEEN NO RBC SEEN NO ORGANISMS SEEN Performed at [facility], [address] [place], [place] [address]    Culture   Final    NO GROWTH 1 DAY Performed at [facility], [address] [place], [place] [address]    Report Status PENDING  Incomplete    Labs: CBC: Recent Labs  Lab 06/10/22 2012 06/12/22 0625  WBC 15.4* 15.4*   NEUTROABS 12.0*  --   HGB 16.1 13.7  HCT 46.6 38.9*  MCV 86.0 84.0  PLT 314 277   Basic Metabolic Panel: Recent Labs  Lab 06/10/22 2012 06/12/22 0625  NA 137 140  K 3.7 3.8  CL 101 112*  CO2 26 23  GLUCOSE 107* 128*  BUN 11 11  CREATININE 0.87 0.75  CALCIUM 9.1 8.8*   Liver Function Tests: Recent Labs  Lab 06/10/22 2012 06/12/22 0625  AST 24 17  ALT 36 28  ALKPHOS 57 45  BILITOT 1.2 0.7  PROT 7.8 6.3*  ALBUMIN 4.1 3.2*   CBG: No results for input(s): "GLUCAP" in the last 168  hours.  Discharge time spent: greater than 30 minutes.  Signed: Lurene Shadow, MD [facility] 06/12/2022

## 2022-06-12 NOTE — Plan of Care (Signed)

## 2022-06-12 NOTE — TOC Initial Note (Signed)
Transition of Care [facility]) - Initial/Assessment Note    Patient Details  Name: Johnathan Mosley MRN: [id number] Date of Birth: 2001/05/25  Transition of Care [facility]) CM/SW Contact:    Chapman Fitch, RN Phone Number: 06/12/2022, 3:48 PM  Clinical Narrative:                  Transition of Care [facility]) Screening Note   Patient Details  Name: Johnathan Mosley Date of Birth: 09-29-2001   Transition of Care [facility]) CM/SW Contact:    Chapman Fitch, RN Phone Number: 06/12/2022, 3:48 PM    Transition of Care Department [facility]) has reviewed patient and no TOC needs have been identified at this time. We will continue to monitor patient advancement through interdisciplinary progression rounds. If new patient transition needs arise, please place a TOC consult.          Patient Goals and CMS Choice        Expected Discharge Plan and Services           Expected Discharge Date: 06/12/22                                    Prior Living Arrangements/Services                       Activities of Daily Living Home Assistive Devices/Equipment: None ADL Screening (condition at time of admission) Patient's cognitive ability adequate to safely complete daily activities?: Yes Is the patient deaf or have difficulty hearing?: No Does the patient have difficulty seeing, even when wearing glasses/contacts?: Yes Does the patient have difficulty concentrating, remembering, or making decisions?: No Patient able to express need for assistance with ADLs?: Yes Does the patient have difficulty dressing or bathing?: No Independently performs ADLs?: Yes (appropriate for developmental age) Does the patient have difficulty walking or climbing stairs?: No Weakness of Legs: None Weakness of Arms/Hands: None  Permission Sought/Granted                  Emotional Assessment              Admission diagnosis:  Meningitis, viral [A87.9] Bad headache [R51.9] Fever,  unspecified [R50.9] Sepsis (HCC) [A41.9] Patient Active Problem List   Diagnosis Date Noted   Sepsis (HCC) 06/11/2022   Obesity with body mass index (BMI) of 30.0 to 39.9 06/11/2022   Meningitis 06/11/2022   GERD (gastroesophageal reflux disease) 06/11/2022   Engages in vaping    PCP:  Mick Sell, MD Pharmacy:   [facility] [address] [place] - [address] [place] [address] Phone: [phone] Fax: [phone]     Social Determinants of Health (SDOH) Interventions    Readmission Risk Interventions     No data to display

## 2022-06-12 NOTE — Progress Notes (Signed)
Date of Admission:  06/11/2022      Principal Problem:   Meningitis Active Problems:   Sepsis (HCC)   Obesity with body mass index (BMI) of 30.0 to 39.9   GERD (gastroesophageal reflux disease)   Engages in vaping    Subjective: Pt doing much better No headache No fever No neck pain Wants to go home     Objective: Vital signs in last 24 hours: Temp:  [97.9 F (36.6 C)-99.1 F (37.3 C)] 97.9 F (36.6 C) (09/13 0813) Pulse Rate:  [59-84] 69 (09/13 0813) Resp:  [18-20] 18 (09/13 0813) BP: (99-118)/(59-85) 99/65 (09/13 0813) SpO2:  [96 %-98 %] 98 % (09/13 0813)    PHYSICAL EXAM:  General: Alert, cooperative, no distress, appears stated age.  Head: Normocephalic, without obvious abnormality, atraumatic. Eyes: Conjunctivae clear, anicteric sclerae. Pupils are equal ENT Nares normal. No drainage or sinus tenderness. Lips, mucosa, and tongue normal. No Thrush Neck: Supple, symmetrical, no adenopathy, thyroid: non tender no carotid bruit and no JVD. Back: No CVA tenderness. Lungs: Clear to auscultation bilaterally. No Wheezing or Rhonchi. No rales. Heart: Regular rate and rhythm, no murmur, rub or gallop. Abdomen: Soft, non-tender,not distended. Bowel sounds normal. No masses Extremities: atraumatic, no cyanosis. No edema. No clubbing Skin: No rashes or lesions. Or bruising Lymph: Cervical, supraclavicular normal. Neurologic: Grossly non-focal  Lab Results Recent Labs    06/10/22 2012 06/12/22 0625  WBC 15.4* 15.4*  HGB 16.1 13.7  HCT 46.6 38.9*  NA 137 140  K 3.7 3.8  CL 101 112*  CO2 26 23  BUN 11 11  CREATININE 0.87 0.75   Liver Panel Recent Labs    06/10/22 2012  PROT 7.8  ALBUMIN 4.1  AST 24  ALT 36  ALKPHOS 57  BILITOT 1.2     Microbiology: [facility] neg CSF culture NG ME panel NEg Studies/Results: CT HEAD WO CONTRAST ( )  Result Date: 06/11/2022 CLINICAL DATA:  Fever and headache; meningitis suspected EXAM: CT HEAD WITHOUT CONTRAST  TECHNIQUE: Contiguous axial images were obtained from the base of the skull through the vertex without intravenous contrast. RADIATION DOSE REDUCTION: This exam was performed according to the departmental dose-optimization program which includes automated exposure control, adjustment of the mA and/or kV according to patient size and/or use of iterative reconstruction technique. COMPARISON:  None Available. FINDINGS: Brain: No intracranial hemorrhage, mass effect, or evidence of acute infarct. No hydrocephalus. No extra-axial fluid collection. Vascular: No hyperdense vessel or unexpected calcification. Skull: No fracture or focal lesion. Sinuses/Orbits: No acute finding. Paranasal sinuses and mastoid air cells are well aerated. Other: None. IMPRESSION: Unremarkable noncontrast CT head. If there is ongoing concern for meningitis consider MRI for further evaluation. Electronically Signed   By: Minerva Fester M.D.   On: 06/11/2022 03:38   DG Chest 2 View  Result Date: 06/10/2022 CLINICAL DATA:  Fever, headache and abdominal pain EXAM: CHEST - 2 VIEW COMPARISON:  None Available. FINDINGS: The heart size and mediastinal contours are within normal limits. Both lungs are clear. The visualized skeletal structures are unremarkable. IMPRESSION: No active cardiopulmonary disease. Electronically Signed   By: Judie Petit.  Shick M.D.   On: 06/10/2022 20:40     Assessment/Plan: 21 yr male presenting with fever and headache and neck pain Recent tick bite LFTS N No thrombocytopenia or leucopenia  Aseptic meningitis Viral VS tick borne Pt was started on Doxy yesterday and looks like no fever or headache or neck pain He wants to go home- will be  discharged on 6 more days of Doxy 100mg  PO BID Follow up PRN if needed- patient aware Informed hopsitalist

## 2022-06-14 LAB — ROCKY MTN SPOTTED FVR ABS PNL(IGG+IGM)
RMSF IgG: POSITIVE — AB
RMSF IgM: 0.68 index (ref 0.00–0.89)

## 2022-06-14 LAB — CSF CULTURE W GRAM STAIN: Culture: NO GROWTH

## 2022-06-14 LAB — HSV 1/2 PCR, CSF
HSV-1 DNA: NEGATIVE
HSV-2 DNA: NEGATIVE

## 2022-06-14 LAB — MISC LABCORP TEST (SEND OUT): Labcorp test code: 138412

## 2022-06-14 LAB — RMSF, IGG, IFA: RMSF, IGG, IFA: 1:128 {titer} — ABNORMAL HIGH

## 2022-06-16 LAB — CULTURE, BLOOD (ROUTINE X 2)
Culture: NO GROWTH
Culture: NO GROWTH

## 2022-06-19 ENCOUNTER — Telehealth: Payer: Self-pay

## 2022-06-19 NOTE — Telephone Encounter (Signed)
Attempted to call patient regarding lab results. Not able to reach him at this time. Let voicemail requesting call back.  Leatrice Jewels, RMA

## 2022-06-19 NOTE — Telephone Encounter (Signed)
-----   Message from Tsosie Billing, MD sent at 06/19/2022 10:10 AM EDT ----- This patient was recently in the hospital for fever and gave a history of tick bite- HE was put on 7 days of Doxycycline and fever resolved. [place] spotted fever test titer came back positive at 1:128. Please let him know and find out how he is doing and whether he has completed the 7 days of Doxy . I am planning to repeat the test in 4 weeks to see whether the titer has gone up ( which it would if this was a recent infection). Please ask him whether he could go to [facility] or come to [facility]   oct 1st week. And if [facility]  which one it would be in [place], so that we can fax it to them- thx ----- Message ----- From: Interface, Lab In [place] Sent: 06/14/2022   4:37 PM EDT To: Tsosie Billing, MD

## 2022-06-20 ENCOUNTER — Encounter: Payer: Self-pay | Admitting: Infectious Diseases

## 2022-06-20 ENCOUNTER — Other Ambulatory Visit: Payer: Self-pay | Admitting: Infectious Diseases

## 2022-06-20 DIAGNOSIS — A770 Spotted fever due to Rickettsia rickettsii: Secondary | ICD-10-CM
